# Patient Record
Sex: Male | Born: 1995
Health system: Southern US, Community
[De-identification: ages and names within clinical notes are randomized; demographics above are authoritative.]

---
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
## 2017-10-05 ENCOUNTER — Ambulatory Visit (INDEPENDENT_AMBULATORY_CARE_PROVIDER_SITE_OTHER): Payer: 59 | Admitting: Sports Medicine

## 2017-10-05 DIAGNOSIS — R7401 Elevation of levels of liver transaminase levels: Secondary | ICD-10-CM

## 2017-10-05 DIAGNOSIS — M7551 Bursitis of right shoulder: Secondary | ICD-10-CM | POA: Diagnosis not present

## 2017-10-05 DIAGNOSIS — L409 Psoriasis, unspecified: Secondary | ICD-10-CM

## 2017-10-05 DIAGNOSIS — R740 Nonspecific elevation of levels of transaminase and lactic acid dehydrogenase [LDH]: Secondary | ICD-10-CM

## 2017-10-05 DIAGNOSIS — Z0000 Encounter for general adult medical examination without abnormal findings: Secondary | ICD-10-CM

## 2017-10-05 DIAGNOSIS — D582 Other hemoglobinopathies: Secondary | ICD-10-CM

## 2017-10-05 MED ORDER — CALCIPOTRIENE 0.005 % EX CREA: TOPICAL_CREAM | Freq: Two times a day (BID) | CUTANEOUS | 3 refills | Status: DC

## 2017-10-05 MED ORDER — MELOXICAM 15 MG PO TABS: ORAL_TABLET | ORAL | 3 refills | Status: DC

## 2017-10-05 NOTE — Patient Instructions (Signed)
Psoriasis Psoriasis is a long-term (chronic) condition of skin inflammation. It occurs because your immune system causes skin cells to form too quickly. As a result, too many skin cells grow and create raised, red patches (plaques) that look silvery on your skin. Plaques may appear anywhere on your body. They can be any size or shape. Psoriasis can come and go. The condition varies from mild to very severe. It cannot be passed from one person to another (not contagious). What are the causes? The cause of psoriasis is not known, but certain factors can make the condition worse. These include:  Damage or trauma to the skin, such as cuts, scrapes, sunburn, and dryness.  Lack of sunlight.  Certain medicines.  Alcohol.  Tobacco use.  Stress.  Infections caused by bacteria or viruses.  What increases the risk? This condition is more likely to develop in:  People with a family history of psoriasis.  People who are Caucasian.  People who are between the ages of 15-30 and 50-60 years old.  What are the signs or symptoms? There are five different types of psoriasis. You can have more than one type of psoriasis during your life. Types are:  Plaque.  Guttate.  Inverse.  Pustular.  Erythrodermic.  Each type of psoriasis has different symptoms.  Plaque psoriasis symptoms include red, raised plaques with a silvery white coating (scale). These plaques may be itchy. Your nails may be pitted and crumbly or fall off.  Guttate psoriasis symptoms include small red spots that often show up on your trunk, arms, and legs. These spots may develop after you have been sick, especially with strep throat.  Inverse psoriasis symptoms include plaques in your underarm area, under your breasts, or on your genitals, groin, or buttocks.  Pustular psoriasis symptoms include pus-filled bumps that are painful, red, and swollen on the palms of your hands or the soles of your feet. You also may feel  exhausted, feverish, weak, or have no appetite.  Erythrodermic psoriasis symptoms include bright red skin that may look burned. You may have a fast heartbeat and a body temperature that is too high or too low. You may be itchy or in pain.  How is this diagnosed? Your health care provider may suspect psoriasis based on your symptoms and family history. Your health care provider will also do a physical exam. This may include a procedure to remove a tissue sample (biopsy) for testing. You may also be referred to a health care provider who specializes in skin diseases (dermatologist). How is this treated? There is no cure for this condition, but treatment can help manage it. Goals of treatment include:  Helping your skin heal.  Reducing itching and inflammation.  Slowing the growth of new skin cells.  Helping your immune system respond better to your skin.  Treatment varies, depending on the severity of your condition. Treatment may include:  Creams or ointments.  Ultraviolet ray exposure (light therapy). This may include natural sunlight or light therapy in a medical office.  Medicines (systemic therapy). These medicines can help your body better manage skin cell turnover and inflammation. They may be used along with light therapy or ointments. You may also get antibiotic medicines if you have an infection.  Follow these instructions at home: Skin Care  Moisturize your skin as needed. Only use moisturizers that have been approved by your health care provider.  Apply cool compresses to the affected areas.  Do not scratch your skin. Lifestyle   Do not   use tobacco products. This includes cigarettes, chewing tobacco, and e-cigarettes. If you need help quitting, ask your health care provider.  Drink little or no alcohol.  Try techniques for stress reduction, such as meditation or yoga.  Get exposure to the sun as told by your health care provider. Do not get sunburned.  Consider  joining a psoriasis support group. Medicines  Take or use over-the-counter and prescription medicines only as told by your health care provider.  If you were prescribed an antibiotic, take or use it as told by your health care provider. Do not stop taking the antibiotic even if your condition starts to improve. General instructions  Keep a journal to help track what triggers an outbreak. Try to avoid any triggers.  See a counselor or social worker if feelings of sadness, frustration, and hopelessness about your condition are interfering with your work and relationships.  Keep all follow-up visits as told by your health care provider. This is important. Contact a health care provider if:  Your pain gets worse.  You have increasing redness or warmth in the affected areas.  You have new or worsening pain or stiffness in your joints.  Your nails start to break easily or pull away from the nail bed.  You have a fever.  You feel depressed. This information is not intended to replace advice given to you by your health care provider. Make sure you discuss any questions you have with your health care provider. Document Released: 10/15/2000 Document Revised: 03/25/2016 Document Reviewed: 03/05/2015 Elsevier Interactive Patient Education  2018 Elsevier Inc.  

## 2017-10-05 NOTE — Assessment & Plan Note (Signed)
Adding meloxicam, rehab exercises, return in 1 month.

## 2017-10-05 NOTE — Progress Notes (Addendum)
  Subjective:    CC: Shoulder pain  HPI: For the past couple of months this pleasant 21 year old male has had pain he localizes of the deltoid of the right shoulder, worse when sleeping, no radiation past the elbow, worse with overhead activities, moderate, persistent.  No trauma, cannot think of anything that may have brought this on.  Skin rash: Localized on both elbows, erythematous, scaly rash, asymptomatic.  Preventive measures: Has never had routine blood work done.  Past medical history:  Negative.  See flowsheet/record as well for more information.  Surgical history: Negative.  See flowsheet/record as well for more information.  Family history: Negative.  See flowsheet/record as well for more information.  Social history: Negative.  See flowsheet/record as well for more information.  Allergies, and medications have been entered into the medical record, reviewed, and no changes needed.   Review of Systems: No fevers, chills, night sweats, weight loss, chest pain, or shortness of breath.   Objective:    General: Well Developed, well nourished, and in no acute distress.  Neuro: Alert and oriented x3, extra-ocular muscles intact, sensation grossly intact.  HEENT: Normocephalic, atraumatic, pupils equal round reactive to light, neck supple, no masses, no lymphadenopathy, thyroid nonpalpable.  Skin: Warm and dry, erythematous, scaly rash over the elbows consistent with psoriasis. Cardiac: Regular rate and rhythm, no murmurs rubs or gallops, no lower extremity edema.  Respiratory: Clear to auscultation bilaterally. Not using accessory muscles, speaking in full sentences. Right shoulder: Inspection reveals no abnormalities, atrophy or asymmetry. Palpation is normal with no tenderness over AC joint or bicipital groove. ROM is full in all planes. Rotator cuff strength normal throughout. No signs of impingement with negative Neer and Hawkin's tests, empty can. Speeds and Yergason's  tests normal. No labral pathology noted with negative Obrien's, negative crank, negative clunk, and good stability. Normal scapular function observed. No painful arc and no drop arm sign. No apprehension sign  Impression and Recommendations:    Psoriasis Adding topical calcipotriene, this is mild and limited to the elbows.  Acute bursitis of right shoulder Adding meloxicam, rehab exercises, return in 1 month.  Annual physical exam Checking routine blood work.  Elevated hemoglobin (HCC) Recheck in a month.  Transaminitis Recheck in a month.  ___________________________________________ Ihor Austinhomas J. Benjamin Stainhekkekandam, M.D., ABFM., CAQSM. Primary Care and Sports Medicine [facility]  Adjunct Instructor of Family Medicine  [employer]

## 2017-10-05 NOTE — Assessment & Plan Note (Signed)
Adding topical calcipotriene, this is mild and limited to the elbows.

## 2017-10-05 NOTE — Assessment & Plan Note (Signed)
Checking routine bloodwork. 

## 2017-10-06 DIAGNOSIS — D582 Other hemoglobinopathies: Secondary | ICD-10-CM | POA: Insufficient documentation

## 2017-10-06 DIAGNOSIS — R740 Nonspecific elevation of levels of transaminase and lactic acid dehydrogenase [LDH]: Secondary | ICD-10-CM

## 2017-10-06 DIAGNOSIS — R7401 Elevation of levels of liver transaminase levels: Secondary | ICD-10-CM | POA: Insufficient documentation

## 2017-10-06 LAB — LIPID PANEL W/REFLEX DIRECT LDL
Cholesterol: 102 mg/dL (ref <200)
HDL: 42 mg/dL (ref >40)
LDL Cholesterol (Calc): 43 mg/dL (calc)
Non-HDL Cholesterol (Calc): 60 mg/dL (calc) (ref <130)
Total CHOL/HDL Ratio: 2.4 (calc) (ref <5.0)
Triglycerides: 86 mg/dL (ref <150)

## 2017-10-06 LAB — COMPREHENSIVE METABOLIC PANEL
Alkaline phosphatase (APISO): 86 U/L (ref 40–115)
BUN: 15 mg/dL (ref 7–25)
Chloride: 101 mmol/L (ref 98–110)
Creat: 1.15 mg/dL (ref 0.60–1.35)
Globulin: 2.5 g/dL (calc) (ref 1.9–3.7)
Potassium: 4.6 mmol/L (ref 3.5–5.3)
Sodium: 137 mmol/L (ref 135–146)

## 2017-10-06 LAB — COMPREHENSIVE METABOLIC PANEL WITH GFR
AG Ratio: 1.9 (calc) (ref 1.0–2.5)
ALT: 50 U/L — ABNORMAL HIGH (ref 9–46)
AST: 25 U/L (ref 10–40)
Albumin: 4.7 g/dL (ref 3.6–5.1)
CO2: 30 mmol/L (ref 20–32)
Calcium: 10.1 mg/dL (ref 8.6–10.3)
Glucose, Bld: 99 mg/dL (ref 65–139)
Total Bilirubin: 1.2 mg/dL (ref 0.2–1.2)
Total Protein: 7.2 g/dL (ref 6.1–8.1)

## 2017-10-06 LAB — CBC
HCT: 50.7 % — ABNORMAL HIGH (ref 38.5–50.0)
Hemoglobin: 17.6 g/dL — ABNORMAL HIGH (ref 13.2–17.1)
MCH: 29.3 pg (ref 27.0–33.0)
MCHC: 34.7 g/dL (ref 32.0–36.0)
MCV: 84.5 fL (ref 80.0–100.0)
MPV: 11.4 fL (ref 7.5–12.5)
Platelets: 245 10*3/uL (ref 140–400)
RBC: 6 Million/uL — ABNORMAL HIGH (ref 4.20–5.80)
RDW: 12.3 % (ref 11.0–15.0)
WBC: 5.8 Thousand/uL (ref 3.8–10.8)

## 2017-10-06 LAB — HEMOGLOBIN A1C
Hgb A1c MFr Bld: 5.2 % of total Hgb (ref <5.7)
Mean Plasma Glucose: 103 (calc)
eAG (mmol/L): 5.7 (calc)

## 2017-10-06 LAB — HIV ANTIBODY (ROUTINE TESTING W REFLEX): HIV 1&2 Ab, 4th Generation: NONREACTIVE

## 2017-10-06 LAB — TSH: TSH: 0.77 mIU/L (ref 0.40–4.50)

## 2017-10-06 LAB — VITAMIN D 25 HYDROXY (VIT D DEFICIENCY, FRACTURES): Vit D, 25-Hydroxy: 27 ng/mL — ABNORMAL LOW (ref 30–100)

## 2017-10-06 MED ORDER — VITAMIN D (ERGOCALCIFEROL) 1.25 MG (50000 UNIT) PO CAPS: 50000.0000 [IU] | ORAL_CAPSULE | ORAL | 0 refills | Status: DC

## 2017-10-06 NOTE — Addendum Note (Signed)
Addended by: Monica BectonHEKKEKANDAM, THOMAS J on: 10/06/2017 08:53 AM   Modules accepted: Orders

## 2017-10-06 NOTE — Assessment & Plan Note (Signed)
Recheck in a month

## 2017-12-02 ENCOUNTER — Ambulatory Visit (INDEPENDENT_AMBULATORY_CARE_PROVIDER_SITE_OTHER): Admitting: Sports Medicine

## 2017-12-02 DIAGNOSIS — R740 Nonspecific elevation of levels of transaminase and lactic acid dehydrogenase [LDH]: Secondary | ICD-10-CM

## 2017-12-02 DIAGNOSIS — L409 Psoriasis, unspecified: Secondary | ICD-10-CM | POA: Diagnosis not present

## 2017-12-02 DIAGNOSIS — D582 Other hemoglobinopathies: Secondary | ICD-10-CM | POA: Diagnosis not present

## 2017-12-02 DIAGNOSIS — R7401 Elevation of levels of liver transaminase levels: Secondary | ICD-10-CM

## 2017-12-02 DIAGNOSIS — M7551 Bursitis of right shoulder: Secondary | ICD-10-CM

## 2017-12-02 DIAGNOSIS — Z23 Encounter for immunization: Secondary | ICD-10-CM

## 2017-12-02 NOTE — Assessment & Plan Note (Addendum)
Rechecking hemoglobin levels. Adding some iron indices and cotinine test.

## 2017-12-02 NOTE — Assessment & Plan Note (Signed)
Resolved with conservative measures. 

## 2017-12-02 NOTE — Progress Notes (Signed)
  Subjective:    CC: Multiple issues  HPI: Right shoulder bursitis: Resolved.  Elevated hemoglobin: Due for recheck.  Denies any use of cigarette or tobacco products.  Transaminitis: Due to recheck.  Denies alcohol use.  Psoriasis: Not really diligent with calcipotriene use, but improved with once daily use.  Agrees to increase to twice daily.  I reviewed the past medical history, family history, social history, surgical history, and allergies today and no changes were needed.  Please see the problem list section below in epic for further details.  Past Medical History: No past medical history on file. Past Surgical History: No past surgical history on file. Social History: Social History   Socioeconomic History  . Marital status: Single    Spouse name: Not on file  . Number of children: Not on file  . Years of education: Not on file  . Highest education level: Not on file  Social Needs  . Financial resource strain: Not on file  . Food insecurity - worry: Not on file  . Food insecurity - inability: Not on file  . Transportation needs - medical: Not on file  . Transportation needs - non-medical: Not on file  Occupational History  . Not on file  Tobacco Use  . Smoking status: Never Smoker  Substance and Sexual Activity  . Alcohol use: No    Alcohol/week: 0.0 oz  . Drug use: No  . Sexual activity: Not on file  Other Topics Concern  . Not on file  Social History Narrative  . Not on file   Family History: No family history on file. Allergies: No Known Allergies Medications: See med rec.  Review of Systems: No fevers, chills, night sweats, weight loss, chest pain, or shortness of breath.   Objective:    General: Well Developed, well nourished, and in no acute distress.  Neuro: Alert and oriented x3, extra-ocular muscles intact, sensation grossly intact.  HEENT: Normocephalic, atraumatic, pupils equal round reactive to light, neck supple, no masses, no  lymphadenopathy, thyroid nonpalpable.  Skin: Warm and dry, psoriasiform rash on the right elbow, better than at the last visit. Cardiac: Regular rate and rhythm, no murmurs rubs or gallops, no lower extremity edema.  Respiratory: Clear to auscultation bilaterally. Not using accessory muscles, speaking in full sentences.  Impression and Recommendations:    Acute bursitis of right shoulder Resolved with conservative measures.  Elevated hemoglobin (HCC) Rechecking hemoglobin levels. Adding some iron indices and cotinine test.  Psoriasis Improving with topical calcipotriene, needs to increase his diligence with twice a day dosing. Mild and limited to the elbows.  Transaminitis Rechecking liver function tests.  ___________________________________________ Ihor Austinhomas J. Benjamin Stainhekkekandam, M.D., ABFM., CAQSM. Primary Care and Sports Medicine [facility]  Adjunct Instructor of Family Medicine  [employer]

## 2017-12-02 NOTE — Assessment & Plan Note (Signed)
Rechecking liver function tests.

## 2017-12-02 NOTE — Assessment & Plan Note (Signed)
Improving with topical calcipotriene, needs to increase his diligence with twice a day dosing. Mild and limited to the elbows.

## 2017-12-04 ENCOUNTER — Encounter: Payer: Self-pay | Admitting: Sports Medicine

## 2017-12-04 DIAGNOSIS — R739 Hyperglycemia, unspecified: Secondary | ICD-10-CM

## 2017-12-05 DIAGNOSIS — R739 Hyperglycemia, unspecified: Secondary | ICD-10-CM | POA: Insufficient documentation

## 2017-12-05 NOTE — Telephone Encounter (Signed)
Please call [facility] and add on A1c to blood already in the lab.

## 2017-12-05 NOTE — Assessment & Plan Note (Signed)
Slightly elevated blood sugar, adding hemoglobin A1c

## 2017-12-07 LAB — ANEMIA PROFILE B
%SAT: 33 % (calc) (ref 15–60)
ABS Retic: 65450 {cells}/uL (ref 25000–9000)
Basophils Absolute: 20 cells/uL (ref 0–200)
Basophils Relative: 0.4 %
Eosinophils Absolute: 309 {cells}/uL (ref 15–500)
Eosinophils Relative: 6.3 %
Ferritin: 76 ng/mL (ref 20–345)
Folate: 17.9 ng/mL
HCT: 49.8 % (ref 38.5–50.0)
Hemoglobin: 17.3 g/dL — ABNORMAL HIGH (ref 13.2–17.1)
Iron: 108 ug/dL (ref 50–195)
Lymphs Abs: 1725 {cells}/uL (ref 850–3900)
MCH: 29.1 pg (ref 27.0–33.0)
MCHC: 34.7 g/dL (ref 32.0–36.0)
MCV: 83.7 fL (ref 80.0–100.0)
MPV: 11.3 fL (ref 7.5–12.5)
Monocytes Relative: 8.8 %
Neutro Abs: 2416 {cells}/uL (ref 1500–7800)
Neutrophils Relative %: 49.3 %
Platelets: 237 10*3/uL (ref 140–400)
RBC: 5.95 10*6/uL — ABNORMAL HIGH (ref 4.20–5.80)
RDW: 12.2 % (ref 11.0–15.0)
Retic Ct Pct: 1.1 %
TIBC: 323 mcg/dL (calc) (ref 250–425)
Total Lymphocyte: 35.2 %
Vitamin B-12: 608 pg/mL (ref 200–1100)
WBC mixed population: 431 {cells}/uL (ref 200–950)
WBC: 4.9 10*3/uL (ref 3.8–10.8)

## 2017-12-07 LAB — NICOTINE/COTININE SP
Cotinine: <2 ng/mL
Nicotine screen: <2 ng/mL

## 2017-12-07 LAB — COMPREHENSIVE METABOLIC PANEL
ALT: 31 U/L (ref 9–46)
AST: 24 U/L (ref 10–40)
Albumin: 4.7 g/dL (ref 3.6–5.1)
Alkaline phosphatase (APISO): 92 U/L (ref 40–115)
BUN: 14 mg/dL (ref 7–25)
Calcium: 10.1 mg/dL (ref 8.6–10.3)
Creat: 1.06 mg/dL (ref 0.60–1.35)
Potassium: 4.6 mmol/L (ref 3.5–5.3)
Sodium: 139 mmol/L (ref 135–146)
Total Bilirubin: 1.1 mg/dL (ref 0.2–1.2)
Total Protein: 7 g/dL (ref 6.1–8.1)

## 2017-12-07 LAB — COMPREHENSIVE METABOLIC PANEL WITH GFR
AG Ratio: 2 (calc) (ref 1.0–2.5)
CO2: 30 mmol/L (ref 20–32)
Chloride: 105 mmol/L (ref 98–110)
Globulin: 2.3 g/dL (ref 1.9–3.7)
Glucose, Bld: 102 mg/dL — ABNORMAL HIGH (ref 65–99)

## 2017-12-07 LAB — HEMOGLOBIN A1C
Hgb A1c MFr Bld: 5.2 % of total Hgb (ref <5.7)
Mean Plasma Glucose: 103 (calc)
eAG (mmol/L): 5.7 (calc)

## 2017-12-30 ENCOUNTER — Ambulatory Visit (INDEPENDENT_AMBULATORY_CARE_PROVIDER_SITE_OTHER): Payer: 59 | Admitting: Sports Medicine

## 2017-12-30 ENCOUNTER — Ambulatory Visit (INDEPENDENT_AMBULATORY_CARE_PROVIDER_SITE_OTHER): Payer: 59

## 2017-12-30 DIAGNOSIS — L409 Psoriasis, unspecified: Secondary | ICD-10-CM | POA: Diagnosis not present

## 2017-12-30 DIAGNOSIS — M545 Low back pain, unspecified: Secondary | ICD-10-CM

## 2017-12-30 MED ORDER — MELOXICAM 15 MG PO TABS: ORAL_TABLET | ORAL | 3 refills | Status: DC

## 2017-12-30 MED ORDER — COAL TAR 20 % SOLN
1.0000 | Freq: Two times a day (BID) | 3 refills | Status: DC
Start: 2017-12-30 — End: 2018-04-28

## 2017-12-30 NOTE — Assessment & Plan Note (Signed)
Continue calcipotriene, there are a few lesions on his face and his leg now. Adding topical coal tar, referral to dermatology as well.

## 2017-12-30 NOTE — Assessment & Plan Note (Signed)
Cervical, x-rays, meloxicam, back brace while at work, he will improve his ergonomics at work. Home rehab exercises given. Return in 6 weeks for this.

## 2017-12-30 NOTE — Progress Notes (Signed)
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  Pain is axial, nothing radicular, no bowel or bladder dysfunction, saddle numbness, constitutional symptoms, no trauma, worse with flexion, Valsalva.  Psoriasis: We did try topical calcipotriene, minimal efficacy, he is also now noted a spot on the as well as his leg.  Ple,  I reviewed the past medical history, family history, social history, surgical history, and allergies today and no changes were needed.  Please see the problem list section below in epic for further details.  Past Medical History: No past medical history on file. Past Surgical History: No past surgical history on file. Social History: Social History   Socioeconomic History  . Marital status: Single    Spouse name: Not on file  . Number of children: Not on file  . Years of education: Not on file  . Highest education level: Not on file  Social Needs  . Financial resource strain: Not on file  . Food insecurity - worry: Not on file  . Food insecurity - inability: Not on file  . Transportation needs - medical: Not on file  . Transportation needs - non-medical: Not on file  Occupational History  . Not on file  Tobacco Use  . Smoking status: Never Smoker  Substance and Sexual Activity  . Alcohol use: No    Alcohol/week: 0.0 oz  . Drug use: No  . Sexual activity: Not on file  Other Topics Concern  . Not on file  Social History Narrative  . Not on file   Family History: No family history on file. Allergies: No Known Allergies Medications: See med rec.  Review of Systems: No fevers, chills, night sweats, weight loss, chest pain, or shortness of breath.   Objective:    General: Well Developed, well nourished, and in no  acute distress.  Neuro: Alert and oriented x3, extra-ocular muscles intact, sensation grossly intact.  HEENT: Normocephalic, atraumatic, pupils equal round reactive to light, neck supple, no masses, no lymphadenopathy, thyroid nonpalpable.  Skin: Warm and dry, psoriasiform rash over the right posterior elbow, right temple, leg. Cardiac: Regular rate and rhythm, no murmurs rubs or gallops, no lower extremity edema.  Respiratory: Clear to auscultation bilaterally. Not using accessory muscles, speaking in full sentences. Back Exam:  Inspection: Unremarkable  Motion: Flexion 45 deg, Extension 45 deg, Side Bending to 45 deg bilaterally,  Rotation to 45 deg bilaterally  SLR laying: Negative  XSLR laying: Negative  Palpable tenderness: None. FABER: negative. Sensory change: Gross sensation intact to all lumbar and sacral dermatomes.  Reflexes: 2+ at both patellar tendons, 2+ at achilles tendons, Babinski's downgoing.  Strength at foot  Plantar-flexion: 5/5 Dorsi-flexion: 5/5 Eversion: 5/5 Inversion: 5/5  Leg strength  Quad: 5/5 Hamstring: 5/5 Hip flexor: 5/5 Hip abductors: 5/5  Gait unremarkable.  Impression and Recommendations:    Psoriasis Continue calcipotriene, there are a few lesions on his face and his leg now. Adding topical coal tar, referral to dermatology as well.  Acute bilateral low back pain without sciatica Cervical, x-rays, meloxicam, back brace while at work, he will improve his ergonomics at work. Home rehab exercises given. Return in 6 weeks for this. ___________________________________________ Ihor Austinhomas J. Benjamin Stainhekkekandam, M.D., ABFM., CAQSM. Primary Care  and [place] Instructor of [place] [employer] [facility] [employer]

## 2018-03-17 ENCOUNTER — Encounter: Payer: Self-pay | Admitting: Sports Medicine

## 2018-03-17 ENCOUNTER — Ambulatory Visit (INDEPENDENT_AMBULATORY_CARE_PROVIDER_SITE_OTHER): Payer: 59 | Admitting: Sports Medicine

## 2018-03-17 DIAGNOSIS — L03031 Cellulitis of right toe: Secondary | ICD-10-CM

## 2018-03-17 MED ORDER — TRAMADOL HCL 50 MG PO TABS: 50.0000 mg | ORAL_TABLET | Freq: Three times a day (TID) | ORAL | 0 refills | Status: DC | PRN

## 2018-03-17 MED ORDER — DOXYCYCLINE HYCLATE 100 MG PO TABS: 100.0000 mg | ORAL_TABLET | Freq: Two times a day (BID) | ORAL | 0 refills | Status: AC

## 2018-03-17 NOTE — Assessment & Plan Note (Signed)
Post lateral nail plate excision with phenol. This is now on the medial nail plate, tramadol, doxycycline, warm compresses and soaks. Return to see me in a week, we will discuss medial nail plate excision at that time.

## 2018-03-17 NOTE — Progress Notes (Signed)
  Subjective:    CC: Toenail infection  HPI: This is a pleasant 22 year old male, a year ago we did a lateral nail plate excision of the great toenail on the right after treating a paronychia aggressively.  He is done well on the lateral nail fold, having a new onset of swelling, redness, pain medial nail fold.  Severe, persistent.  I reviewed the past medical history, family history, social history, surgical history, and allergies today and no changes were needed.  Please see the problem list section below in epic for further details.  Past Medical History: No past medical history on file. Past Surgical History: No past surgical history on file. Social History: Social History   Socioeconomic History  . Marital status: Single    Spouse name: Not on file  . Number of children: Not on file  . Years of education: Not on file  . Highest education level: Not on file  Occupational History  . Not on file  Social Needs  . Financial resource strain: Not on file  . Food insecurity:    Worry: Not on file    Inability: Not on file  . Transportation needs:    Medical: Not on file    Non-medical: Not on file  Tobacco Use  . Smoking status: Never Smoker  Substance and Sexual Activity  . Alcohol use: No    Alcohol/week: 0.0 oz  . Drug use: No  . Sexual activity: Not on file  Lifestyle  . Physical activity:    Days per week: Not on file    Minutes per session: Not on file  . Stress: Not on file  Relationships  . Social connections:    Talks on phone: Not on file    Gets together: Not on file    Attends religious service: Not on file    Active member of club or organization: Not on file    Attends meetings of clubs or organizations: Not on file    Relationship status: Not on file  Other Topics Concern  . Not on file  Social History Narrative  . Not on file   Family History: No family history on file. Allergies: No Known Allergies Medications: See med rec.  Review of  Systems: No fevers, chills, night sweats, weight loss, chest pain, or shortness of breath.   Objective:    General: Well Developed, well nourished, and in no acute distress.  Neuro: Alert and oriented x3, extra-ocular muscles intact, sensation grossly intact.  HEENT: Normocephalic, atraumatic, pupils equal round reactive to light, neck supple, no masses, no lymphadenopathy, thyroid nonpalpable.  Skin: Warm and dry, no rashes. Cardiac: Regular rate and rhythm, no murmurs rubs or gallops, no lower extremity edema.  Respiratory: Clear to auscultation bilaterally. Not using accessory muscles, speaking in full sentences. Right foot: Severe paronychia without visible or palpable fluctuance at the medial nail fold of the great toe.  Impression and Recommendations:    Paronychia of great toe, right Post lateral nail plate excision with phenol. This is now on the medial nail plate, tramadol, doxycycline, warm compresses and soaks. Return to see me in a week, we will discuss medial nail plate excision at that time. ___________________________________________ Ihor Austin. Benjamin Stain, M.D., ABFM., CAQSM. Primary Care and Sports Medicine [facility]  Adjunct Instructor of Family Medicine  [employer]

## 2018-03-17 NOTE — Patient Instructions (Signed)
Paronychia Paronychia is an infection of the skin that surrounds a nail. It usually affects the skin around a fingernail, but it may also occur near a toenail. It often causes pain and swelling around the nail. This condition may come on suddenly or develop over a longer period. In some cases, a collection of pus (abscess) can form near or under the nail. Usually, paronychia is not serious and it clears up with treatment. What are the causes? This condition may be caused by bacteria or fungi. It is commonly caused by either Streptococcus or Staphylococcus bacteria. The bacteria or fungi often cause the infection by getting into the affected area through an opening in the skin, such as a cut or a hangnail. What increases the risk? This condition is more likely to develop in:  People who get their hands wet often, such as those who work as dishwashers, bartenders, or nurses.  People who bite their fingernails or suck their thumbs.  People who trim their nails too short.  People who have hangnails or injured fingertips.  People who get manicures.  People who have diabetes.  What are the signs or symptoms? Symptoms of this condition include:  Redness and swelling of the skin near the nail.  Tenderness around the nail when you touch the area.  Pus-filled bumps under the cuticle. The cuticle is the skin at the base or sides of the nail.  Fluid or pus under the nail.  Throbbing pain in the area.  How is this diagnosed? This condition is usually diagnosed with a physical exam. In some cases, a sample of pus may be taken from an abscess to be tested in a lab. This can help to determine what type of bacteria or fungi is causing the condition. How is this treated? Treatment for this condition depends on the cause and severity of the condition. If the condition is mild, it may clear up on its own in a few days. Your health care provider may recommend soaking the affected area in warm water a  few times a day. When treatment is needed, the options may include:  Antibiotic medicine, if the condition is caused by a bacterial infection.  Antifungal medicine, if the condition is caused by a fungal infection.  Incision and drainage, if an abscess is present. In this procedure, the health care provider will cut open the abscess so the pus can drain out.  Follow these instructions at home:  Soak the affected area in warm water if directed to do so by your health care provider. You may be told to do this for 20 minutes, 2-3 times a day. Keep the area dry in between soakings.  Take medicines only as directed by your health care provider.  If you were prescribed an antibiotic medicine, finish all of it even if you start to feel better.  Keep the affected area clean.  Do not try to drain a fluid-filled bump yourself.  If you will be washing dishes or performing other tasks that require your hands to get wet, wear rubber gloves. You should also wear gloves if your hands might come in contact with irritating substances, such as cleaners or chemicals.  Follow your health care provider's instructions about: ? Wound care. ? Bandage (dressing) changes and removal. Contact a health care provider if:  Your symptoms get worse or do not improve with treatment.  You have a fever or chills.  You have redness spreading from the affected area.  You have continued   or increased fluid, blood, or pus coming from the affected area.  Your finger or knuckle becomes swollen or is difficult to move. This information is not intended to replace advice given to you by your health care provider. Make sure you discuss any questions you have with your health care provider. Document Released: 04/13/2001 Document Revised: 03/25/2016 Document Reviewed: 09/25/2014 Elsevier Interactive Patient Education  2018 Elsevier Inc.  

## 2018-04-28 ENCOUNTER — Ambulatory Visit (INDEPENDENT_AMBULATORY_CARE_PROVIDER_SITE_OTHER): Payer: 59 | Admitting: Sports Medicine

## 2018-04-28 ENCOUNTER — Encounter: Payer: Self-pay | Admitting: Sports Medicine

## 2018-04-28 DIAGNOSIS — M7551 Bursitis of right shoulder: Secondary | ICD-10-CM | POA: Diagnosis not present

## 2018-04-28 NOTE — Assessment & Plan Note (Signed)
Recurrence of right shoulder bursitis. Pain is sufficient to keep him from doing his rehab. Subacromial injection today, switching to formal physical therapy.   Return to see me in 6 weeks.

## 2018-04-28 NOTE — Progress Notes (Signed)
Subjective:    CC: Right shoulder pain  HPI: This is a pleasant 22 year old male, I seen him back in December for shoulder bursitis, he did well with conservative measures, more recently he has been trying to do his rehab exercises but having trouble, persistent pain over the deltoid, worse with overhead activities, moderate, persistent.  Desires interventional treatment today.  He would also like to do physical therapy at [facility] here in [place].  I reviewed the past medical history, family history, social history, surgical history, and allergies today and no changes were needed.  Please see the problem list section below in epic for further details.  Past Medical History: No past medical history on file. Past Surgical History: No past surgical history on file. Social History: Social History   Socioeconomic History  . Marital status: Single    Spouse name: Not on file  . Number of children: Not on file  . Years of education: Not on file  . Highest education level: Not on file  Occupational History  . Not on file  Social Needs  . Financial resource strain: Not on file  . Food insecurity:    Worry: Not on file    Inability: Not on file  . Transportation needs:    Medical: Not on file    Non-medical: Not on file  Tobacco Use  . Smoking status: Never Smoker  . Smokeless tobacco: Never Used  Substance and Sexual Activity  . Alcohol use: No    Alcohol/week: 0.0 oz  . Drug use: No  . Sexual activity: Not on file  Lifestyle  . Physical activity:    Days per week: Not on file    Minutes per session: Not on file  . Stress: Not on file  Relationships  . Social connections:    Talks on phone: Not on file    Gets together: Not on file    Attends religious service: Not on file    Active member of club or organization: Not on file    Attends meetings of clubs or organizations: Not on file    Relationship status: Not on file  Other Topics Concern  . Not on file  Social  History Narrative  . Not on file   Family History: No family history on file. Allergies: No Known Allergies Medications: See med rec.  Review of Systems: No fevers, chills, night sweats, weight loss, chest pain, or shortness of breath.   Objective:    General: Well Developed, well nourished, and in no acute distress.  Neuro: Alert and oriented x3, extra-ocular muscles intact, sensation grossly intact.  HEENT: Normocephalic, atraumatic, pupils equal round reactive to light, neck supple, no masses, no lymphadenopathy, thyroid nonpalpable.  Skin: Warm and dry, no rashes. Cardiac: Regular rate and rhythm, no murmurs rubs or gallops, no lower extremity edema.  Respiratory: Clear to auscultation bilaterally. Not using accessory muscles, speaking in full sentences.  Procedure: Real-time Ultrasound Guided Injection of right sub-acromial bursa Device: GE Logiq E  Verbal informed consent obtained.  Time-out conducted.  Noted no overlying erythema, induration, or other signs of local infection.  Skin prepped in a sterile fashion.  Local anesthesia: Topical Ethyl chloride.  With sterile technique and under real time ultrasound guidance: 1 cc kenalog 40, 1 cc lidocaine, 1 cc bupivacaine injected easily Completed without difficulty  Pain immediately resolved suggesting accurate placement of the medication.  Advised to call if fevers/chills, erythema, induration, drainage, or persistent bleeding.  Images permanently stored and available for  review in the ultrasound unit.  Impression: Technically successful ultrasound guided injection.  Impression and Recommendations:    Acute bursitis of right shoulder Recurrence of right shoulder bursitis. Pain is sufficient to keep him from doing his rehab. Subacromial injection today, switching to formal physical therapy.   Return to see me in 6 weeks. ___________________________________________ Ihor Austinhomas J. Benjamin Stainhekkekandam, M.D., ABFM., CAQSM. Primary Care  and Sports Medicine [facility]  Adjunct Instructor of Family Medicine  [employer]

## 2018-05-01 ENCOUNTER — Ambulatory Visit: Payer: 59 | Admitting: Sports Medicine

## 2018-11-17 ENCOUNTER — Encounter: Payer: Self-pay | Admitting: Sports Medicine

## 2018-11-17 ENCOUNTER — Ambulatory Visit (INDEPENDENT_AMBULATORY_CARE_PROVIDER_SITE_OTHER): Payer: BLUE CROSS/BLUE SHIELD | Admitting: Sports Medicine

## 2018-11-17 DIAGNOSIS — L03031 Cellulitis of right toe: Secondary | ICD-10-CM | POA: Diagnosis not present

## 2018-11-17 NOTE — Progress Notes (Signed)
Subjective:    CC: Toe swelling  HPI: This is a pleasant 23 year old male, he has a history of paronychia is medial and lateral in his right foot.  We did a lateral nail plate excision and his lateral great toenail has been symptom-free since.  Earlier in 2019 we did doxycycline for a medial paronychia, he had a temporary improvement but now has recurrence of symptoms.  Moderate, worsening.  Localized without radiation.  I reviewed the past medical history, family history, social history, surgical history, and allergies today and no changes were needed.  Please see the problem list section below in epic for further details.  Past Medical History: No past medical history on file. Past Surgical History: No past surgical history on file. Social History: Social History   Socioeconomic History  . Marital status: Single    Spouse name: Not on file  . Number of children: Not on file  . Years of education: Not on file  . Highest education level: Not on file  Occupational History  . Not on file  Social Needs  . Financial resource strain: Not on file  . Food insecurity:    Worry: Not on file    Inability: Not on file  . Transportation needs:    Medical: Not on file    Non-medical: Not on file  Tobacco Use  . Smoking status: Never Smoker  . Smokeless tobacco: Never Used  Substance and Sexual Activity  . Alcohol use: No    Alcohol/week: 0.0 standard drinks  . Drug use: No  . Sexual activity: Not on file  Lifestyle  . Physical activity:    Days per week: Not on file    Minutes per session: Not on file  . Stress: Not on file  Relationships  . Social connections:    Talks on phone: Not on file    Gets together: Not on file    Attends religious service: Not on file    Active member of club or organization: Not on file    Attends meetings of clubs or organizations: Not on file    Relationship status: Not on file  Other Topics Concern  . Not on file  Social History Narrative    . Not on file   Family History: No family history on file. Allergies: No Known Allergies Medications: See med rec.  Review of Systems: No fevers, chills, night sweats, weight loss, chest pain, or shortness of breath.   Objective:    General: Well Developed, well nourished, and in no acute distress.  Neuro: Alert and oriented x3, extra-ocular muscles intact, sensation grossly intact.  HEENT: Normocephalic, atraumatic, pupils equal round reactive to light, neck supple, no masses, no lymphadenopathy, thyroid nonpalpable.  Skin: Warm and dry, no rashes. Cardiac: Regular rate and rhythm, no murmurs rubs or gallops, no lower extremity edema.  Respiratory: Clear to auscultation bilaterally. Not using accessory muscles, speaking in full sentences. Right foot: Medial nail fold is erythematous, it is draining purulent fluid, swollen, ingrown toenail.  Procedure:  Removal of right great medial toenail with phenol matricectomy. Risks, benefits, alternatives explained to patient. Consent obtained. Time out conducted. Noted no overlying induration or erythema at site of injection. Toe cleaned with alcohol, then a total of 10cc lidocaine 2% infiltrated at adjacent webspaces at the location of the bifurcation of the common digital nerve to proper digital nerves.  Some lidocaine also infiltrated at hyponychium and under nail bed.  Adequate anesthesia ensured. Toe prepped and draped in a  sterile fashion. Nail elevator used to separate medial nail plate from nail bed. Clippers used to cut toenail in a longitudinal fashion to proximal nail fold and matrix. Hemostat then used to separate nail fragment from surrounding structures. Nail bed and matrix treated. Minor bleeding controlled with pressure and phenol. Antibiotic ointment applied. Toe dressed. Advised to return if increased redness, swelling, drainage, fevers, or chills.  Impression and Recommendations:    Paronychia of great toe,  right Has done fantastic after a lateral nail plate excision with phenol matricectomy. Several months ago we treated a medial nail fold paronychia with doxycycline, it resolved for a while and then came back. Today I performed a medial nail plate excision with phenol matricectomy. Return to see me in 2 weeks for wound check. ___________________________________________ Ihor Austin. Benjamin Stain, M.D., ABFM., CAQSM. Primary Care and Sports Medicine [facility]  Adjunct Professor of Family Medicine  [employer]

## 2018-11-17 NOTE — Assessment & Plan Note (Signed)
Has done fantastic after a lateral nail plate excision with phenol matricectomy. Several months ago we treated a medial nail fold paronychia with doxycycline, it resolved for a while and then came back. Today I performed a medial nail plate excision with phenol matricectomy. Return to see me in 2 weeks for wound check.

## 2018-12-01 ENCOUNTER — Ambulatory Visit (INDEPENDENT_AMBULATORY_CARE_PROVIDER_SITE_OTHER): Payer: BLUE CROSS/BLUE SHIELD | Admitting: Sports Medicine

## 2018-12-01 ENCOUNTER — Encounter: Payer: Self-pay | Admitting: Sports Medicine

## 2018-12-01 DIAGNOSIS — L03031 Cellulitis of right toe: Secondary | ICD-10-CM

## 2018-12-01 MED ORDER — SULFAMETHOXAZOLE-TRIMETHOPRIM 800-160 MG PO TABS: 1.0000 | ORAL_TABLET | Freq: Two times a day (BID) | ORAL | 0 refills | Status: DC

## 2018-12-01 NOTE — Progress Notes (Signed)
  Subjective:    CC: Recheck toe  HPI: This is a pleasant 23 year old male, we treated him recently for a right great toenail medial paronychia and ingrown toenail with a partial nail plate removal with phenol matricectomy.  Overall he is doing well.  I reviewed the past medical history, family history, social history, surgical history, and allergies today and no changes were needed.  Please see the problem list section below in epic for further details.  Past Medical History: No past medical history on file. Past Surgical History: No past surgical history on file. Social History: Social History   Socioeconomic History  . Marital status: Single    Spouse name: Not on file  . Number of children: Not on file  . Years of education: Not on file  . Highest education level: Not on file  Occupational History  . Not on file  Social Needs  . Financial resource strain: Not on file  . Food insecurity:    Worry: Not on file    Inability: Not on file  . Transportation needs:    Medical: Not on file    Non-medical: Not on file  Tobacco Use  . Smoking status: Never Smoker  . Smokeless tobacco: Never Used  Substance and Sexual Activity  . Alcohol use: No    Alcohol/week: 0.0 standard drinks  . Drug use: No  . Sexual activity: Not on file  Lifestyle  . Physical activity:    Days per week: Not on file    Minutes per session: Not on file  . Stress: Not on file  Relationships  . Social connections:    Talks on phone: Not on file    Gets together: Not on file    Attends religious service: Not on file    Active member of club or organization: Not on file    Attends meetings of clubs or organizations: Not on file    Relationship status: Not on file  Other Topics Concern  . Not on file  Social History Narrative  . Not on file   Family History: No family history on file. Allergies: No Known Allergies Medications: See med rec.  Review of Systems: No fevers, chills, night sweats,  weight loss, chest pain, or shortness of breath.   Objective:    General: Well Developed, well nourished, and in no acute distress.  Neuro: Alert and oriented x3, extra-ocular muscles intact, sensation grossly intact.  HEENT: Normocephalic, atraumatic, pupils equal round reactive to light, neck supple, no masses, no lymphadenopathy, thyroid nonpalpable.  Skin: Warm and dry, no rashes. Cardiac: Regular rate and rhythm, no murmurs rubs or gallops, no lower extremity edema.  Respiratory: Clear to auscultation bilaterally. Not using accessory muscles, speaking in full sentences. Right great toe: Healing well, there is only a tiny bit of purulent drainage.  Impression and Recommendations:    Paronychia of great toe, right Doing great after right medial nail plate excision. Minimal purulence, this was expressed, cleaned. Adding a course of Septra. Return as needed. ___________________________________________ Ihor Austin. Benjamin Stain, M.D., ABFM., CAQSM. Primary Care and Sports Medicine [facility]  Adjunct Professor of Family Medicine  [employer]

## 2018-12-01 NOTE — Assessment & Plan Note (Signed)
Doing great after right medial nail plate excision. Minimal purulence, this was expressed, cleaned. Adding a course of Septra. Return as needed.

## 2019-01-27 ENCOUNTER — Encounter: Payer: Self-pay | Admitting: Sports Medicine

## 2019-02-03 ENCOUNTER — Other Ambulatory Visit: Payer: Self-pay

## 2019-02-03 ENCOUNTER — Encounter: Payer: Self-pay | Admitting: Emergency Medicine

## 2019-02-03 ENCOUNTER — Emergency Department (INDEPENDENT_AMBULATORY_CARE_PROVIDER_SITE_OTHER)
Admission: EM | Admit: 2019-02-03 | Discharge: 2019-02-03 | Disposition: A | Discharge status: Home / Self Care | Payer: BLUE CROSS/BLUE SHIELD | Source: Home / Self Care

## 2019-02-03 DIAGNOSIS — B353 Tinea pedis: Secondary | ICD-10-CM

## 2019-02-03 MED ORDER — CLOTRIMAZOLE 1 % EX CREA: TOPICAL_CREAM | CUTANEOUS | 0 refills | Status: AC

## 2019-02-03 NOTE — ED Provider Notes (Signed)
[facility]    CSN: [id number] Arrival date & time: 02/03/19  1119     History   Chief Complaint Chief Complaint  Patient presents with  . Foot Pain    HPI Phillip Johnson is a 23 y.o. male.   HPI Phillip Johnson is a 23 y.o. male presenting to UC with c/o 5 days of Right foot pain and redness on the bottom of his foot. Pain is a burning "pins and needles" sensation. No known injury. Denies wearing new shoes or standing for prolonged period of time. He has not tried anything for his symptoms.    History reviewed. No pertinent past medical history.  Patient Active Problem List   Diagnosis Date Noted  . Paronychia of great toe, right 03/17/2018  . Acute bilateral low back pain without sciatica 12/30/2017  . Hyperglycemia 12/05/2017  . Transaminitis 10/06/2017  . Elevated hemoglobin (HCC) 10/06/2017  . Acute bursitis of right shoulder 10/05/2017  . Psoriasis 10/05/2017  . Annual physical exam 10/05/2017    History reviewed. No pertinent surgical history.     Home Medications    Prior to Admission medications   Medication Sig Start Date End Date Taking? Authorizing Provider  clotrimazole (LOTRIMIN) 1 % cream Apply to affected area 2 times daily for up to 4 weeks 02/03/19   Lurene Shadow, PA-C    Family History No family history on file.  Social History Social History   Tobacco Use  . Smoking status: Never Smoker  . Smokeless tobacco: Never Used  Substance Use Topics  . Alcohol use: No    Alcohol/week: 0.0 standard drinks  . Drug use: No     Allergies   Patient has no known allergies.   Review of Systems Review of Systems  Musculoskeletal: Negative for arthralgias and joint swelling.  Skin: Positive for color change. Negative for wound.     Physical Exam Triage Vital Signs ED Triage Vitals [02/03/19 1135]  Enc Vitals Group     BP 125/69     Pulse Rate (!) 53     Resp      Temp (!) 97.5 F (36.4 C)     Temp Source Oral     SpO2 98  %     Weight 220 lb (99.8 kg)     Height 6\' 4"  (1.93 m)     Head Circumference      Peak Flow      Pain Score 5     Pain Loc      Pain Edu?      Excl. in GC?    No data found.  Updated Vital Signs BP 125/69 (BP Location: Right Arm)   Pulse (!) 53   Temp (!) 97.5 F (36.4 C) (Oral)   Ht 6\' 4"  (1.93 m)   Wt 220 lb (99.8 kg)   SpO2 98%   BMI 26.78 kg/m   Visual Acuity Right Eye Distance:   Left Eye Distance:   Bilateral Distance:    Right Eye Near:   Left Eye Near:    Bilateral Near:     Physical Exam Vitals signs and nursing note reviewed.  Constitutional:      Appearance: Normal appearance. He is well-developed.  HENT:     Head: Normocephalic and atraumatic.  Neck:     Musculoskeletal: Normal range of motion.  Cardiovascular:     Rate and Rhythm: Normal rate.  Pulmonary:     Effort: Pulmonary effort is normal.  Musculoskeletal: Normal  range of motion.        General: Tenderness present. No swelling.       Feet:     Comments: Right foot: no edema or deformity. Mild tenderness over plantar aspect of foot over distal metatarsals/ball of foot  Skin:    General: Skin is warm and dry.     Findings: Erythema present.  Neurological:     Mental Status: He is alert and oriented to person, place, and time.  Psychiatric:        Behavior: Behavior normal.      UC Treatments / Results  Labs (all labs ordered are listed, but only abnormal results are displayed) Labs Reviewed - No data to display  EKG None  Radiology No results found.  Procedures Procedures (including critical care time)  Medications Ordered in UC Medications - No data to display  Initial Impression / Assessment and Plan / UC Course  I have reviewed the triage vital signs and the nursing notes.  Pertinent labs & imaging results that were available during my care of the patient were reviewed by me and considered in my medical decision making (see chart for details).     Exam c/w tinea  Will tx with clotrimazole F/u in 2-3 weeks if not improving.  Final Clinical Impressions(s) / UC Diagnoses   Final diagnoses:  Tinea pedis of right foot   Discharge Instructions   None    ED Prescriptions    Medication Sig Dispense Auth. Provider   clotrimazole (LOTRIMIN) 1 % cream Apply to affected area 2 times daily for up to 4 weeks 28 g Lurene Shadow, PA-C     Controlled Substance Prescriptions [place] Controlled Substance Registry consulted? Not Applicable   Lurene Shadow, PA-C 02/03/19 1210

## 2019-02-03 NOTE — ED Triage Notes (Signed)
Patient c/o right foot pain x 5 days, no injury, some redness, no swelling.

## 2019-04-23 ENCOUNTER — Encounter: Payer: Self-pay | Admitting: Sports Medicine

## 2019-10-01 IMAGING — DX DG LUMBAR SPINE COMPLETE 4+V
5 series · 5 of 5 positions shown · non-contrast
Comparison: None available

CLINICAL DATA: Low back pain for 2 weeks

EXAM:
LUMBAR SPINE - COMPLETE 4+ VIEW

[l-spine ap]
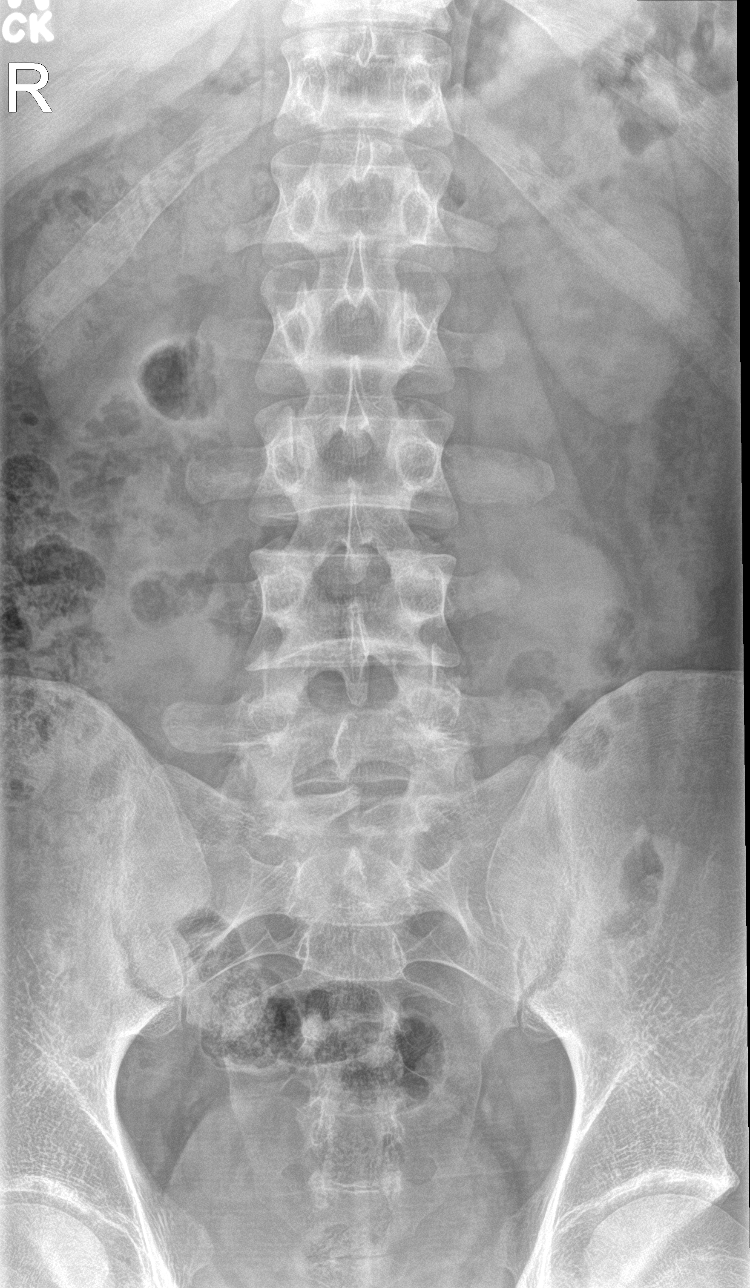

[l-spine obl (1 of 2)]
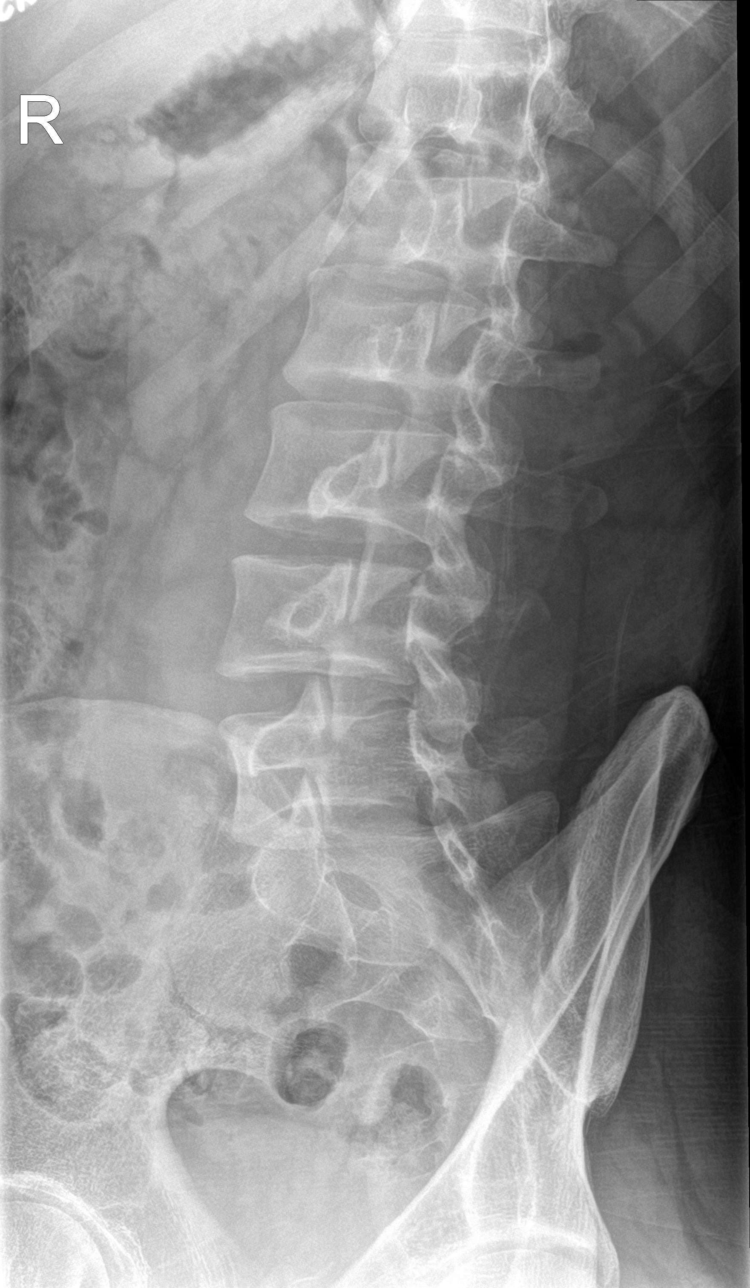

[l-spine obl (2 of 2)]
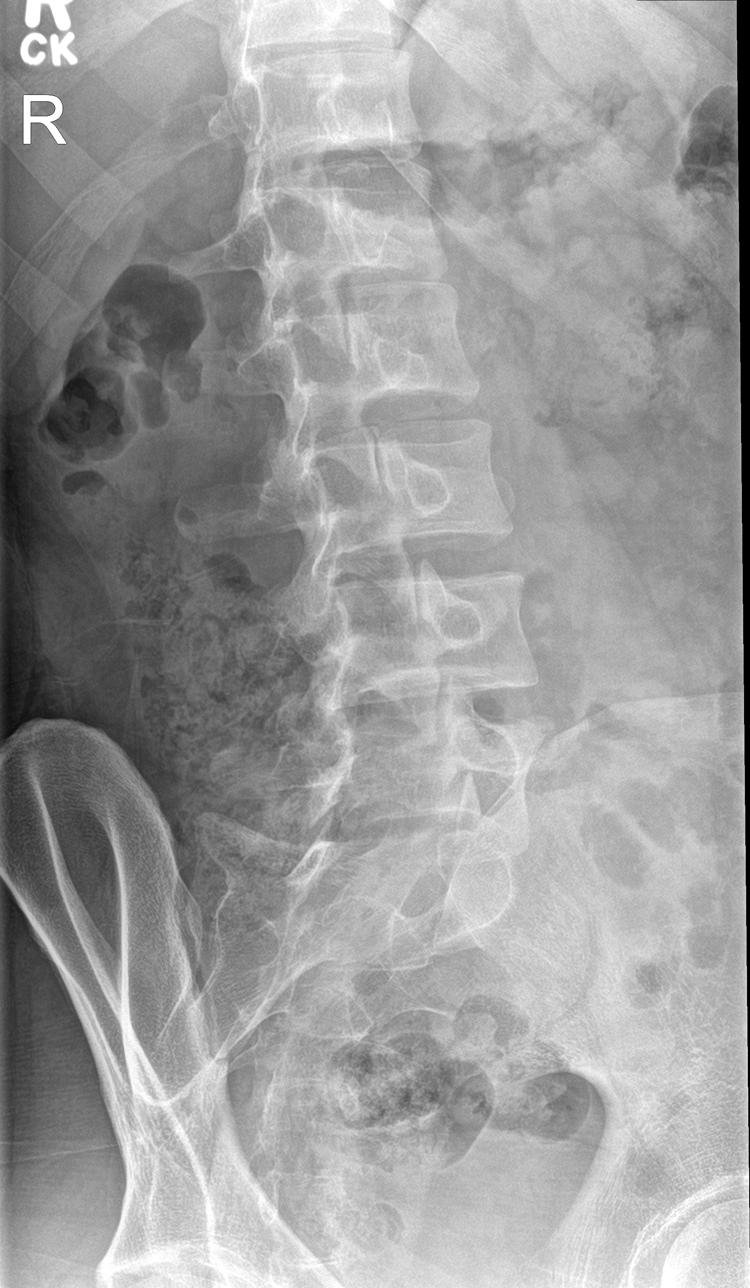

[l-spine lat]
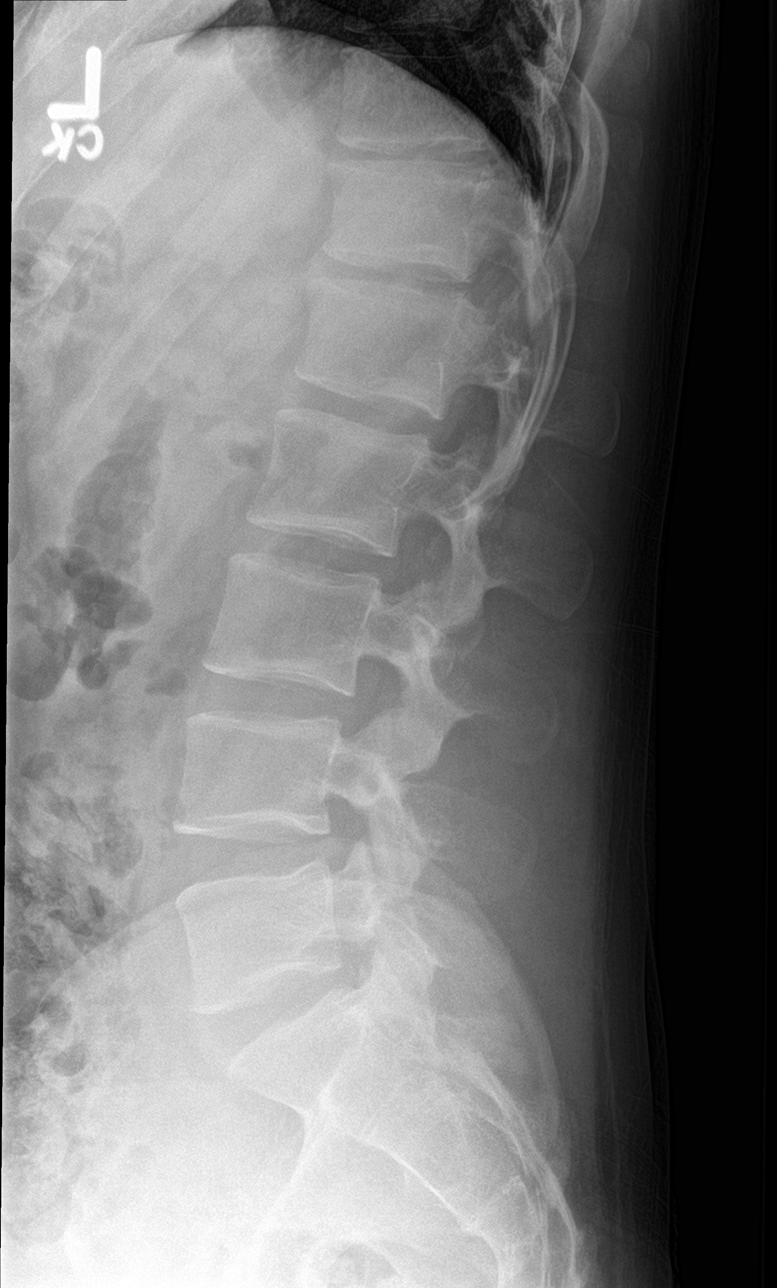

[l-spine spot]
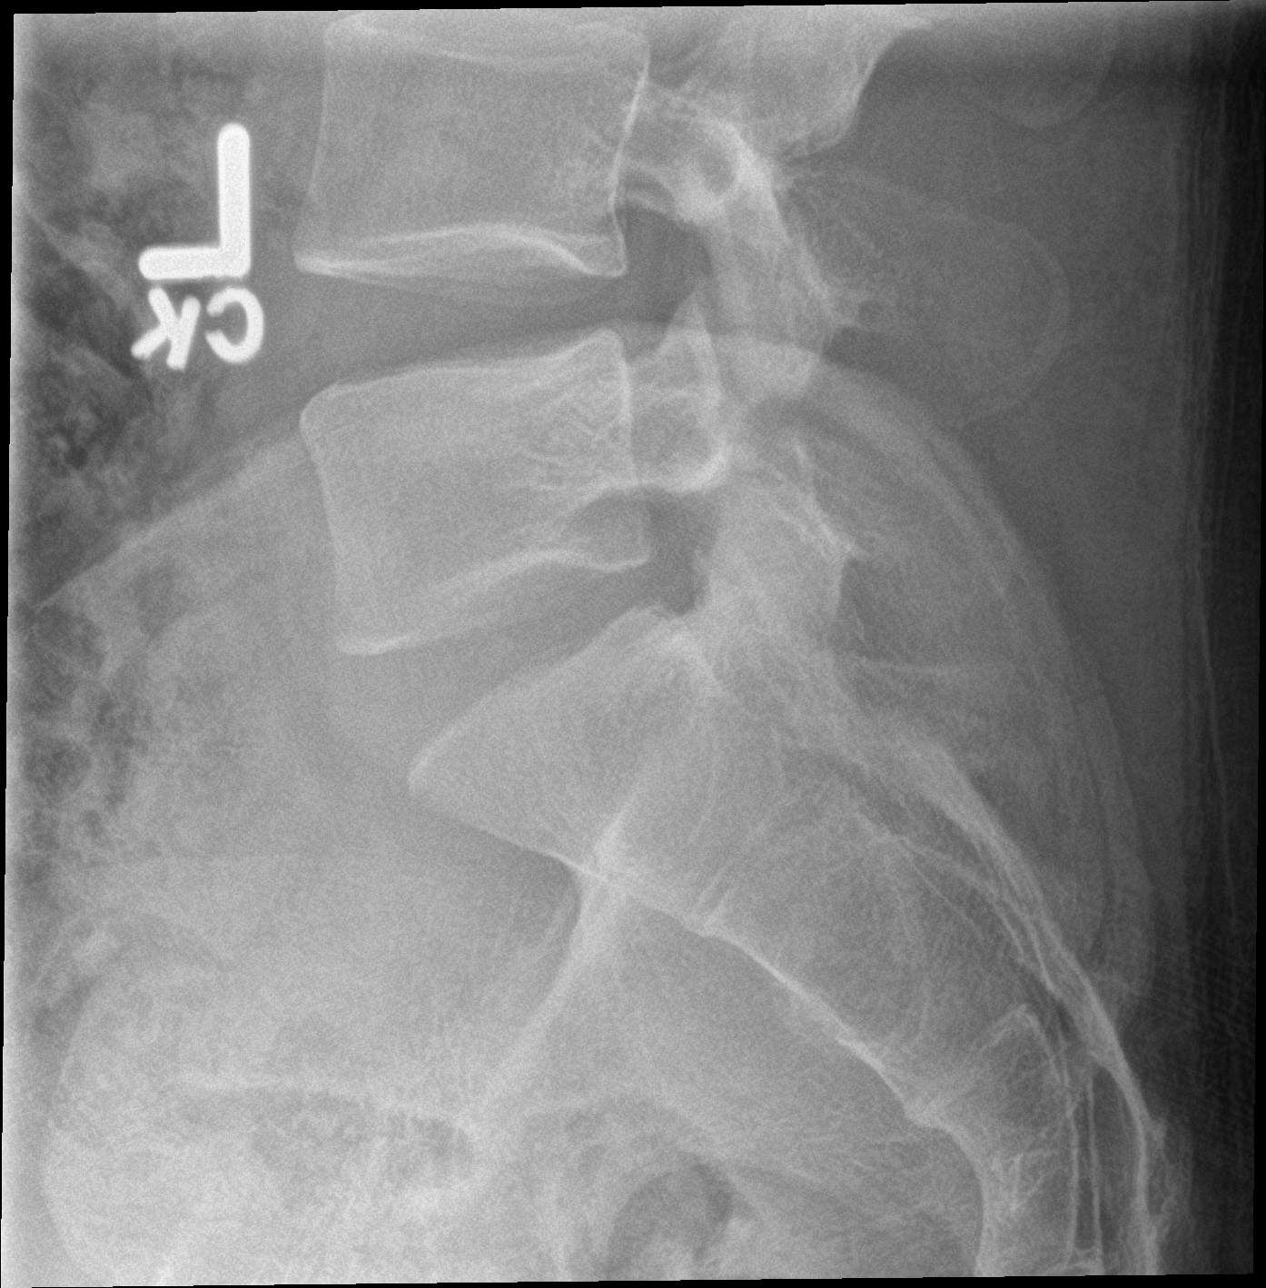

[5 of 5 positions shown; findings below may reference images not displayed]

FINDINGS: Normal alignment. Preserved vertebral body heights and disc spaces.
No significant degenerative process or spondylosis. No acute
compression fracture, wedge-shaped deformity or focal kyphosis.
Normal appearing pedicles and SI joints.

Subtle oblique lucency through the L5 pars region on the lateral
views, difficult to exclude subtle L5 pars defects. No associated
anterolisthesis.
IMPRESSION: No acute finding by plain radiography.

Difficult to exclude L5 pars defects on the lateral view.
